# Patient Record
Sex: Male | Born: 1965 | Race: White | Hispanic: No | Marital: Single | Smoking: Former smoker
Health system: Southern US, Community
[De-identification: ages and names within clinical notes are randomized; demographics above are authoritative.]

## PROBLEM LIST (undated history)

## (undated) DIAGNOSIS — E079 Disorder of thyroid, unspecified: Secondary | ICD-10-CM

## (undated) HISTORY — PX: HERNIA REPAIR: SHX51

---

## 2016-06-08 ENCOUNTER — Telehealth: Payer: Self-pay | Admitting: *Deleted

## 2016-06-08 ENCOUNTER — Emergency Department
Admission: EM | Admit: 2016-06-08 | Discharge: 2016-06-08 | Disposition: A | Discharge status: Home / Self Care | Payer: Managed Care, Other (non HMO) | Source: Home / Self Care | Attending: Family Medicine | Admitting: Family Medicine

## 2016-06-08 ENCOUNTER — Encounter: Payer: Self-pay | Admitting: *Deleted

## 2016-06-08 DIAGNOSIS — J069 Acute upper respiratory infection, unspecified: Secondary | ICD-10-CM | POA: Diagnosis not present

## 2016-06-08 DIAGNOSIS — J302 Other seasonal allergic rhinitis: Secondary | ICD-10-CM

## 2016-06-08 DIAGNOSIS — B9789 Other viral agents as the cause of diseases classified elsewhere: Principal | ICD-10-CM

## 2016-06-08 DIAGNOSIS — Z8639 Personal history of other endocrine, nutritional and metabolic disease: Secondary | ICD-10-CM

## 2016-06-08 HISTORY — DX: Disorder of thyroid, unspecified: E07.9

## 2016-06-08 MED ORDER — PREDNISONE 20 MG PO TABS: 20.0000 mg | ORAL_TABLET | Freq: Two times a day (BID) | ORAL | 0 refills | Status: DC

## 2016-06-08 MED ORDER — GUAIFENESIN-CODEINE 100-10 MG/5ML PO SOLN: ORAL | 0 refills | Status: DC

## 2016-06-08 NOTE — ED Triage Notes (Signed)
Pt c/o 2 days of cough, runny nose and nasal congestion. Aches yesterday. Taken IBF @ home. Afebrile.

## 2016-06-08 NOTE — Telephone Encounter (Signed)
Pt reports the cough syrup prescribed here made him nauseated, requesting something else. Per Dr. Cathren Harsh, called in Benzonatate 200mg  1 qhs prn cough. Left on provider VM @ his [facility] pharmacy.

## 2016-06-08 NOTE — ED Provider Notes (Signed)
[facility]    CSN: [id number] Arrival date & time: 06/08/16  4098  First Provider Contact:  First MD Initiated Contact with Patient 06/08/16 1003        History   Chief Complaint Chief Complaint  Patient presents with  . Cough  . Nasal Congestion    HPI Thomas Patel is a 50 y.o. male.   Patient complains of three day history of typical cold-like symptoms developing over several days,  including mild sore throat, sinus congestion, headache, fatigue, and cough.  No fevers, chills, and sweats.  He has had occasional wheezing.   He has a history of seasonal rhinitis.  He notes that in the past his colds have always tended to linger.  Family history of asthma/COPD in his mother. He also has a history of hypothyroid and plans to establish care with a new PCP locally.  He states that he has not had followup of his TSH in over a year.   The history is provided by the patient.    Past Medical History:  Diagnosis Date  . Thyroid disease     There are no active problems to display for this patient.   Past Surgical History:  Procedure Laterality Date  . HERNIA REPAIR         Home Medications    Prior to Admission medications   Medication Sig Start Date End Date Taking? Authorizing Provider  levothyroxine (SYNTHROID, LEVOTHROID) 75 MCG tablet Take 75 mcg by mouth daily before breakfast.   Yes Historical Provider, MD  guaiFENesin-codeine 100-10 MG/5ML syrup Take 10mL by mouth at bedtime as needed for cough 06/08/16   Lattie Haw, MD  predniSONE (DELTASONE) 20 MG tablet Take 1 tablet (20 mg total) by mouth 2 (two) times daily. Take with food. 06/08/16   Lattie Haw, MD    Family History History reviewed.  Mother has asthma/COPD  Social History Social History  Substance Use Topics  . Smoking status: Former Smoker    Quit date: 06/08/1996  . Smokeless tobacco: Never Used  . Alcohol use No     Allergies   Review of patient's allergies indicates  no known allergies.   Review of Systems Review of Systems + sore throat + cough No pleuritic pain + occasional wheezing + nasal congestion + post-nasal drainage No sinus pain/pressure No itchy/red eyes No earache No hemoptysis No SOB No fever/chills No nausea No vomiting No abdominal pain No diarrhea No urinary symptoms No skin rash + fatigue + myalgias + headache Used OTC meds without relief   Physical Exam Triage Vital Signs ED Triage Vitals  Enc Vitals Group     BP 06/08/16 0957 119/79     Pulse Rate 06/08/16 0957 60     Resp 06/08/16 0957 16     Temp 06/08/16 0957 98.4 F (36.9 C)     Temp Source 06/08/16 0957 Oral     SpO2 06/08/16 0957 99 %     Weight 06/08/16 0957 259 lb (117.5 kg)     Height 06/08/16 0957 6' (1.829 m)     Head Circumference --      Peak Flow --      Pain Score 06/08/16 0959 0     Pain Loc --      Pain Edu? --      Excl. in GC? --    No data found.   Updated Vital Signs BP 119/79 (BP Location: Left Arm)   Pulse 60  Temp 98.4 F (36.9 C) (Oral)   Resp 16   Ht 6' (1.829 m)   Wt 259 lb (117.5 kg)   SpO2 99%   BMI 35.13 kg/m    Physical Exam Nursing notes and Vital Signs reviewed. Appearance:  Patient appears stated age, and in no acute distress Eyes:  Pupils are equal, round, and reactive to light and accomodation.  Extraocular movement is intact.  Conjunctivae are not inflamed  Ears:  Canals normal.  Tympanic membranes normal.  Nose:  Mildly congested turbinates.  No sinus tenderness.    Pharynx:  Normal Neck:  Supple.  Mildly tender enlarged posterior/lateral nodes are palpated bilaterally  Lungs:  Clear to auscultation.  Breath sounds are equal.  Moving air well. Heart:  Regular rate and rhythm without murmurs, rubs, or gallops.  Abdomen:  Nontender without masses or hepatosplenomegaly.  Bowel sounds are present.  No CVA or flank tenderness.  Extremities:  No edema.  Skin:  No rash present.    UC Treatments /  Results  Labs (all labs ordered are listed, but only abnormal results are displayed) Labs Reviewed - No data to display  EKG  EKG Interpretation None       Radiology No results found.  Procedures Procedures (including critical care time)  Medications Ordered in UC Medications - No data to display   Initial Impression / Assessment and Plan / UC Course  I have reviewed the triage vital signs and the nursing notes.  Pertinent labs & imaging results that were available during my care of the patient were reviewed by me and considered in my medical decision making (see chart for details).  Clinical Course       Final Clinical Impressions(s) / UC Diagnoses   Final diagnoses:  Viral URI with cough  History of hypothyroidism  Seasonal rhinitis   There is no evidence of bacterial infection today.   With patient's history of seasonal rhinitis, family history of asthma, and past history of viral URI's that linger, he probably has a predisposition to mild reactive airways disease. Will therefore begin prednisone burst. Rx for Robitussin AC for night time cough.  Take plain guaifenesin (1200mg  extended release tabs such as Mucinex) twice daily, with plenty of water, for cough and congestion.  May add Pseudoephedrine (30mg , one or two every 4 to 6 hours) for sinus congestion.  Get adequate rest.   May use Afrin nasal spray (or generic oxymetazoline) twice daily for about 5 days and then discontinue.  Also recommend using saline nasal spray several times daily and saline nasal irrigation (AYR is a common brand).  Use Flonase nasal spray each morning after using Afrin nasal spray and saline nasal irrigation. Try warm salt water gargles for sore throat.  Stop all antihistamines for now, and other non-prescription cough/cold preparations. May take Ibuprofen 200mg , 4 tabs every 8 hours with food for body aches, headache, sore throat, etc.   Follow-up with family doctor if not improving  about10 days. Patient also needs to establish care with a PCP for management of his hypothyroid. New Prescriptions New Prescriptions   GUAIFENESIN-CODEINE 100-10 MG/5ML SYRUP    Take 20mL by mouth at bedtime as needed for cough   PREDNISONE (DELTASONE) 20 MG TABLET    Take 1 tablet (20 mg total) by mouth 2 (two) times daily. Take with food.     Lattie Haw, MD 06/08/16 1052

## 2016-06-08 NOTE — Discharge Instructions (Signed)
Take plain guaifenesin (1200mg  extended release tabs such as Mucinex) twice daily, with plenty of water, for cough and congestion.  May add Pseudoephedrine (30mg , one or two every 4 to 6 hours) for sinus congestion.  Get adequate rest.   May use Afrin nasal spray (or generic oxymetazoline) twice daily for about 5 days and then discontinue.  Also recommend using saline nasal spray several times daily and saline nasal irrigation (AYR is a common brand).  Use Flonase nasal spray each morning after using Afrin nasal spray and saline nasal irrigation. Try warm salt water gargles for sore throat.  Stop all antihistamines for now, and other non-prescription cough/cold preparations. May take Ibuprofen 200mg , 4 tabs every 8 hours with food for body aches, headache, sore throat, etc.   Follow-up with family doctor if not improving about10 days.

## 2016-06-15 ENCOUNTER — Ambulatory Visit: Payer: Managed Care, Other (non HMO) | Admitting: Osteopathic Medicine

## 2016-06-16 ENCOUNTER — Ambulatory Visit: Payer: Managed Care, Other (non HMO) | Admitting: Osteopathic Medicine

## 2016-06-20 ENCOUNTER — Other Ambulatory Visit: Payer: Self-pay

## 2016-06-21 ENCOUNTER — Telehealth: Payer: Self-pay

## 2016-06-21 ENCOUNTER — Encounter: Payer: Self-pay | Admitting: Osteopathic Medicine

## 2016-06-21 ENCOUNTER — Ambulatory Visit (INDEPENDENT_AMBULATORY_CARE_PROVIDER_SITE_OTHER): Payer: Managed Care, Other (non HMO) | Admitting: Osteopathic Medicine

## 2016-06-21 VITALS — BP 125/78 | HR 91 | Ht 72.0 in | Wt 256.0 lb

## 2016-06-21 DIAGNOSIS — F411 Generalized anxiety disorder: Secondary | ICD-10-CM

## 2016-06-21 DIAGNOSIS — Z87891 Personal history of nicotine dependence: Secondary | ICD-10-CM

## 2016-06-21 DIAGNOSIS — E039 Hypothyroidism, unspecified: Secondary | ICD-10-CM | POA: Insufficient documentation

## 2016-06-21 DIAGNOSIS — Z0000 Encounter for general adult medical examination without abnormal findings: Secondary | ICD-10-CM

## 2016-06-21 MED ORDER — FLUOXETINE HCL 20 MG PO TABS: 20.0000 mg | ORAL_TABLET | Freq: Every day | ORAL | 3 refills | Status: DC

## 2016-06-21 NOTE — Telephone Encounter (Signed)
Patient called stated that he needs the note to say excuse him from work August 7th til now. I explained to the patient that if his job requested anything more we would only be able to provide information from today's visit only. Please advise . Stacey Sago,CMA

## 2016-06-21 NOTE — Progress Notes (Signed)
HPI: Thomas Patel is a 50 y.o. male  who [facility] today, 06/21/16,  for chief complaint of:  Chief Complaint  Patient presents with  . Establish Care    ANNUAL, THYROID MEDICATION AND ANXIETY ATTACKS     Hypothyroid: Long-standing diagnosis, patient has not had labs in over a year, has been taking medication regularly over the past month or so since he's been low.  Anxiety/panic: See below for GAD7 or PHQ9. Patient screened negative for mood disorder questionnaire. Reports increasing frequency of panic attacks, feels whole-body shakes, lightheaded, flushing, he is to be happening more with increased stress at work. Has been going on throughout his lifetime. He has never been on medications or sought counseling for this, never had to be in the hospital for any psychiatric reason. Patient is a lot questions about possible side effects of medication, he is nervous to start any type of medication. He is nervous to go to counseling because he doesn't want have to talk about his feelings. Patient is very concerned about possibility of weight gain on any type of medication    Past medical, surgical, social and family history reviewed: Past Medical History:  Diagnosis Date  . Thyroid disease    Past Surgical History:  Procedure Laterality Date  . HERNIA REPAIR     Social History  Substance Use Topics  . Smoking status: Former Smoker    Quit date: 06/13/1999  . Smokeless tobacco: Never Used  . Alcohol use No   Family History  Problem Relation Age of Onset  . Arthritis Mother   . Asthma Mother   . Lung disease Mother   . Cancer Paternal Aunt      Current medication list and allergy/intolerance information reviewed:   Current Outpatient Prescriptions  Medication Sig Dispense Refill  . levothyroxine (SYNTHROID, LEVOTHROID) 100 MCG tablet Take by mouth.     No current facility-administered medications for this visit.    No Known  Allergies    Review of Systems:  Constitutional:  No  fever, no chills, No recent illness, No unintentional weight changes. No significant fatigue.   HEENT: No  headache, no vision change, no hearing change, No sore throat, No  sinus pressure  Cardiac: No  chest pain, No  pressure, No palpitations, No  Orthopnea  Respiratory:  No  shortness of breath. No  Cough  Gastrointestinal: No  abdominal pain, No  nausea, No  vomiting,  No  blood in stool, No  diarrhea, No  constipation   Musculoskeletal: No new myalgia/arthralgia  Genitourinary: No  incontinence, No  abnormal genital bleeding, No abnormal genital discharge  Skin: No  Rash, No other wounds/concerning lesions  Hem/Onc: No  easy bruising/bleeding, No  abnormal lymph node  Endocrine: No cold intolerance,  No heat intolerance. No polyuria/polydipsia/polyphagia   Neurologic: No  weakness, No  dizziness, No  slurred speech/focal weakness/facial droop  Psychiatric: No  concerns with depression, (+) concerns with anxiety, No sleep problems, No mood problems  Exam:  BP 125/78   Pulse 91   Ht 6' (1.829 m)   Wt 256 lb (116.1 kg)   BMI 34.72 kg/m   Constitutional: VS see above. General Appearance: alert, well-developed, well-nourished, NAD  Eyes: Normal lids and conjunctive, non-icteric sclera  Ears, Nose, Mouth, Throat: MMM, Normal external inspection ears/nares/mouth/lips/gums.  Neck: No masses, trachea midline. No thyroid enlargement. No tenderness/mass appreciated. No lymphadenopathy  Respiratory: Normal respiratory effort. no wheeze, no rhonchi, no rales  Cardiovascular: S1/S2 normal, no murmur, no rub/gallop auscultated. RRR. No lower extremity edema  Musculoskeletal: Gait normal.   Neurological: Normal balance/coordination. No tremor.   Skin: warm, dry, intact. No rash/ulcer. No concerning nevi or subq nodules on limited exam.    Psychiatric: Fair judgment/insight. Normal mood and affect. Oriented x3. Pressured  speech but no thought disorder.   GAD 7 : Generalized Anxiety Score 06/21/2016  Nervous, Anxious, on Edge 1  Control/stop worrying 0  Worry too much - different things 1  Trouble relaxing 1  Restless 1  Easily annoyed or irritable 1  Afraid - awful might happen 0  Total GAD 7 Score 5  Anxiety Difficulty Somewhat difficult    Depression screen PHQ 2/9 06/21/2016  Decreased Interest 1  Down, Depressed, Hopeless 0  PHQ - 2 Score 1  Altered sleeping 1  Tired, decreased energy 1  Change in appetite 1  Feeling bad or failure about yourself  0  Trouble concentrating 3  Moving slowly or fidgety/restless 1  Suicidal thoughts 0  PHQ-9 Score 8     ASSESSMENT/PLAN:   Anxiety state - Plan: FLUoxetine (PROZAC) 20 MG tablet, Ambulatory referral to Psychology - counseling/CBT for panic issues - develop coping skills.   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 Patient is nervous about taking medications.   Given lack of identifiable trigger, not convinced that benzodiazepine therapy to premedicate for anxiety causing event will be of any help, would rather try out daily medication for prevention and see if this helps. Patient is educated at length on the option for her medication, counseling, he is going back and forth about whether or not to start medication  I sent in Prozac and we'll see how he does with this if he decides to take it. Patient advised on side effects of this medicine, is tolerating the medication can increase the dose in 2 weeks, plan to follow-up with me in the next 6 weeks for brief discussion about how this is working and may be able to do annual appointment. Would also consider diagnosis of ADHD  Hypothyroidism, unspecified hypothyroidism type - Plan: TSH - We'll hold off on refilling medication until we have lab  results, since he's been taking medication intermittently over the past 8 weeks or so, would repeat TSH again in 8 weeks after getting him on a stable dose  History of tobacco use disorder  Annual physical exam - Plan: CBC with Differential/Platelet, COMPLETE METABOLIC PANEL WITH GFR, Lipid panel - this is unlisted diagnoses for lab ordering purposes, I did not bill for this visit today    Visit summary with medication list and pertinent instructions was printed for patient to review. All questions at time of visit were answered - patient instructed to contact office with any additional concerns. ER/RTC precautions were reviewed with the patient. Follow-up plan: Return in about 6 weeks (around 08/02/2016), or sooner if needed, for ANNUAL PHYSICAL .  Note: Total time spent 45 minutes, greater than 50% of the visit was spent face-to-face counseling and coordinating care for the following: The primary encounter diagnosis was Anxiety state. Diagnoses of Hypothyroidism, unspecified hypothyroidism type, History of tobacco use disorder

## 2016-06-21 NOTE — Patient Instructions (Signed)
DR. Mardelle MatteALEXANDER'S IMPORTANT  INFORMATION FOR NEW PATIENTS!  PLEASE REVIEW CAREFULLY!    FOLLOW-UP!   Let's plan to follow-up in the office in 6 weeks for ANNUAL PHYSICAL AND RECHECK ANXIETY/PANIC SYMPTOMS.   Long term, let's plan to follow-up regularly months for management/monitoring of chronic medical issues such as thyroid issues, as well as management of your prescription medications.   Please don't hesitate to make an appointment sooner if you're having any acute concerns or problems!  REGARDING PRESCRIPTION MEDICATIONS  Please let your pharmacy know when you are running low on medications/refills (do not wait until you are out of medicines). Your pharmacy will send our office a request for the appropriate medications. Please allow our office 2-3 business days to process the needed refills.   For controlled substances, you may be asked to schedule an office visit and/or undergo a random urine drug screen for continuation of such medications.   REGARDING ANY CARE YOU RECEIVE OUTSIDE OUR OFFICE  At any visits to any specialists, or if you receive vaccines anywhere outside our office, please provide our clinic information so that they can forward us any records, including any tests which are done, changes to your medications, or new vaccinations.   Also, if you are ever treated in an emergency room or if you are admitted to the hospital, please contact our office after your discharge. We encourage our patients to schedule a follow-up visit with their PCP any time they are treated for a serious illness or injury.   This allows all your physicians to communicate effectively, putting your primary care doctor at the center of your medical care and allowing us to effectively coordinate your care.  REGARDING PREVENTIVE CARE & WELLNESS, AKA "ANNUAL PHYSICAL"  Let's plan to follow-up here in the office in 6 weeks for ANNUAL WELLNESS & PREVENTIVE CARE PHYSICAL.   This visit is very important to  make sure we talk with our patients about all recommended cancer screenings, vaccines, birth control (if desired) and screenings for other diseases based on your personal/family history. Plus, this visit should be completely covered under your insurance!   We also like to talk about any changes to your health over the past year and make sure any chronic conditions are well-controlled.    Please note: insurance should completely cover one preventive care visit annually, and they should completely cover most tests associated with preventive care such as routine labs, mammograms, etc. If you have any other medical concerns to address, you may be asked to reschedule your annual physical, or schedule a separate visit to address other medical concerns. Or, you may be billed for care related to "problem-based visit" in addition to your "preventive care visit." If you have questions about this, please contact our office, your insurance company, or [facility] billing department.   REGARDING ANY FORMS NEEDING YOUR DOCTOR'S SIGNATURE  If you ever have any paperwork which needs to be completed by your PCP, you may be asked to come to the office if that paperwork requires a complex review of your medical history - we want to make sure everything is completed to your satisfaction and is completed correctly the first time, particularly with FMLA or other employment/legal matters!    Please let us know if there is anything else we can do for you. Take care! -Dr. Mervyn SkeetersA.

## 2016-06-22 NOTE — Telephone Encounter (Signed)
I wrote that I saw the patient and gave date of exam and asked him to be excused for any time needed for the appointment - I was hoping this would be vague enough to allow him somewhat though room, I know that he had to take some time off work since he travels. If his employer is requiring documentation of his need to be out for more than the day of the appointment, I'm not sure that I can help with that since I did not see him on the seventh.   Please ask him to forward me a specific documentation requirements for his employer, otherwise, I can only provide documentation that I saw him on a given day.

## 2016-06-22 NOTE — Telephone Encounter (Signed)
Left detailed message on patient vm with exact message as noted below. Rhonda Cunningham,CMA

## 2016-06-23 LAB — CBC WITH DIFFERENTIAL/PLATELET
BASOS ABS: 97 {cells}/uL (ref 0–200)
BASOS PCT: 1 %
EOS ABS: 291 {cells}/uL (ref 15–500)
Eosinophils Relative: 3 %
HEMATOCRIT: 46.4 % (ref 38.5–50.0)
Hemoglobin: 16.2 g/dL (ref 13.2–17.1)
LYMPHS PCT: 35 %
Lymphs Abs: 3395 cells/uL (ref 850–3900)
MCH: 33.1 pg — AB (ref 27.0–33.0)
MCHC: 34.9 g/dL (ref 32.0–36.0)
MCV: 94.7 fL (ref 80.0–100.0)
MONO ABS: 873 {cells}/uL (ref 200–950)
MONOS PCT: 9 %
MPV: 11.2 fL (ref 7.5–12.5)
NEUTROS PCT: 52 %
Neutro Abs: 5044 cells/uL (ref 1500–7800)
PLATELETS: 264 10*3/uL (ref 140–400)
RBC: 4.9 MIL/uL (ref 4.20–5.80)
RDW: 13.3 % (ref 11.0–15.0)
WBC: 9.7 10*3/uL (ref 3.8–10.8)

## 2016-06-23 LAB — LIPID PANEL
CHOLESTEROL: 144 mg/dL (ref 125–200)
HDL: 40 mg/dL (ref >=40)
LDL Cholesterol: 87 mg/dL (ref <130)
TRIGLYCERIDES: 84 mg/dL (ref <150)
Total CHOL/HDL Ratio: 3.6 Ratio (ref <=5.0)
VLDL: 17 mg/dL (ref <30)

## 2016-06-23 LAB — COMPLETE METABOLIC PANEL WITH GFR
ALT: 13 U/L (ref 9–46)
AST: 14 U/L (ref 10–40)
Albumin: 4 g/dL (ref 3.6–5.1)
Alkaline Phosphatase: 68 U/L (ref 40–115)
BILIRUBIN TOTAL: 0.5 mg/dL (ref 0.2–1.2)
BUN: 16 mg/dL (ref 7–25)
CALCIUM: 9 mg/dL (ref 8.6–10.3)
CHLORIDE: 104 mmol/L (ref 98–110)
CO2: 27 mmol/L (ref 20–31)
CREATININE: 1.03 mg/dL (ref 0.60–1.35)
GFR, EST NON AFRICAN AMERICAN: 86 mL/min (ref >=60)
Glucose, Bld: 101 mg/dL — ABNORMAL HIGH (ref 65–99)
Potassium: 4.8 mmol/L (ref 3.5–5.3)
Sodium: 141 mmol/L (ref 135–146)
TOTAL PROTEIN: 6.2 g/dL (ref 6.1–8.1)

## 2016-06-23 LAB — TSH: TSH: 5.42 m[IU]/L — AB (ref 0.40–4.50)

## 2016-06-26 MED ORDER — LEVOTHYROXINE SODIUM 100 MCG PO TABS: 100.0000 ug | ORAL_TABLET | Freq: Every day | ORAL | 2 refills | Status: DC

## 2016-06-26 NOTE — Addendum Note (Signed)
Addended by: Deirdre PippinsALEXANDER, Anique Beckley M on: 06/26/2016 12:53 PM   Modules accepted: Orders

## 2016-06-29 ENCOUNTER — Encounter: Payer: Self-pay | Admitting: Osteopathic Medicine

## 2016-06-29 ENCOUNTER — Telehealth: Payer: Self-pay

## 2016-06-29 NOTE — Telephone Encounter (Signed)
Dizziness is a documented side effect which can occur in 9% of patients taking this medication. Since I did not personally evaluate him at the time of this episode, I could not say for sure whether this was simply due to the medicine or due to something else. If he requires a work note, I don't feel that I can excuse him from work for this incident, however I am happy to provide documentation of the 9% incidence of dizziness which may occur in this medicine. I printed a letter for him, left this at the front desk. He is having concerns for side effects of medications, he should come in for a visit so we can discuss alternative medicines

## 2016-06-29 NOTE — Telephone Encounter (Signed)
Patient called stated that started Prozac 20 mg and it makes him dizzy. Patient stated that he left work early today and request a letter from PCP explaining side effects of medications and excusing him from work today. Last OV was 06/21/2016. Please advise.Brithany Whitworth,CMA

## 2016-06-29 NOTE — Telephone Encounter (Signed)
PATIENT HAS BEEN INFORMED. Captain Blucher,CMA  

## 2016-06-30 ENCOUNTER — Ambulatory Visit: Payer: Managed Care, Other (non HMO) | Admitting: Osteopathic Medicine

## 2016-07-26 ENCOUNTER — Ambulatory Visit (INDEPENDENT_AMBULATORY_CARE_PROVIDER_SITE_OTHER): Payer: Managed Care, Other (non HMO) | Admitting: Osteopathic Medicine

## 2016-07-26 ENCOUNTER — Encounter: Payer: Self-pay | Admitting: Osteopathic Medicine

## 2016-07-26 VITALS — BP 118/68 | HR 69 | Ht 72.0 in | Wt 260.0 lb

## 2016-07-26 DIAGNOSIS — F411 Generalized anxiety disorder: Secondary | ICD-10-CM

## 2016-07-26 DIAGNOSIS — E039 Hypothyroidism, unspecified: Secondary | ICD-10-CM

## 2016-07-26 MED ORDER — FLUOXETINE HCL 40 MG PO CAPS: 40.0000 mg | ORAL_CAPSULE | Freq: Every day | ORAL | 1 refills | Status: DC

## 2016-07-26 NOTE — Patient Instructions (Signed)
First week in October - can come straight to lab for thyroid level recheck.

## 2016-07-26 NOTE — Progress Notes (Signed)
HPI: Thomas Patel is a 50 y.o. male  who presents to  [facility] today, 07/26/16,  for chief complaint of:  Chief Complaint  Patient presents with  . Medication Problem    PROZAC questions about side effects versus effectiveness of medicine     Anxiety/panic: See below for GAD7 & PHQ9. Patient screened negative for mood disorder questionnaireAt initial visit. Patient came to me with complaints of increasing frequency of panic attacks, feels whole-body shakes, lightheaded, flushing, he is to be happening more with increased stress at work. Has been going on throughout his lifetime. He notes that his symptoms are feeling a little bit better, he states his son has noticed a difference in his mood, seems much less curable. He has not increased the Prozac yet, has been on the medication for a total of about 3 weeks. He states that he was having a few mornings where he felt like he just couldn't get out of bed, but was able to go back to sleep. States that once he was up and moving around he felt a little bit better. He's wondering if this could be a side effect of the medication or just "all in my head". No thoughts of suicide.       Past medical, surgical, social and family history reviewed: Past Medical History:  Diagnosis Date  . Thyroid disease    Past Surgical History:  Procedure Laterality Date  . HERNIA REPAIR     Social History  Substance Use Topics  . Smoking status: Former Smoker    Quit date: 06/13/1999  . Smokeless tobacco: Never Used  . Alcohol use No   Family History  Problem Relation Age of Onset  . Arthritis Mother   . Asthma Mother   . Lung disease Mother   . Cancer Paternal Aunt      Current medication list and allergy/intolerance information reviewed:   Current Outpatient Prescriptions  Medication Sig Dispense Refill  . FLUoxetine (PROZAC) 20 MG tablet Take 1 tablet (20 mg total) by mouth daily. If no side effects, can  increase to 2 tablets daily after 2 weeks on the medicine 30 tablet 3  . levothyroxine (SYNTHROID, LEVOTHROID) 100 MCG tablet Take 1 tablet (100 mcg total) by mouth daily before breakfast. 30 tablet 2   No current facility-administered medications for this visit.    No Known Allergies    Review of Systems:  Constitutional:  No  fever, no chills, No recent illness, No unintentional weight changes. No significant fatigue.   Cardiac: No  chest pain, No  pressure  Respiratory:  No  shortness of breath  Gastrointestinal: No  abdominal pain, No  nausea  Psychiatric: (+)concerns with depression, (+) concerns with anxiety, No sleep problems, No mood problems  Exam:  BP 118/68   Pulse 69   Ht 6' (1.829 m)   Wt 260 lb (117.9 kg)   BMI 35.26 kg/m   Constitutional: VS see above. General Appearance: alert, well-developed, well-nourished, NAD  Ears, Nose, Mouth, Throat: MMM,  Respiratory: Normal respiratory effort.   Neurological: Normal balance/coordination. No tremor.   Skin: warm, dry, intact  Psychiatric: Normal judgment/insight. Normal mood and affect. Oriented x3. Pressured speech but no thought disorder. Seems a bit calmer than he was at last visit but still a bit anxious  GAD 7 : Generalized Anxiety Score 06/21/2016  Nervous, Anxious, on Edge 1  Control/stop worrying 0  Worry too much - different things 1  Trouble relaxing 1  Restless 1  Easily annoyed or irritable 1  Afraid - awful might happen 0  Total GAD 7 Score 5  Anxiety Difficulty Somewhat difficult    Depression screen PHQ 2/9 06/21/2016  Decreased Interest 1  Down, Depressed, Hopeless 0  PHQ - 2 Score 1  Altered sleeping 1  Tired, decreased energy 1  Change in appetite 1  Feeling bad or failure about yourself  0  Trouble concentrating 3  Moving slowly or fidgety/restless 1  Suicidal thoughts 0  PHQ-9 Score 8     ASSESSMENT/PLAN:   Anxiety state - Can go ahead and increase the dose of the Prozac and  see if this helps. Plan to follow-up in 6 weeks  Hypothyroidism, unspecified hypothyroidism type - Plan: TSH   Patient Instructions  First week in October - can come straight to lab for thyroid level recheck.      Visit summary with medication list and pertinent instructions was printed for patient to review. All questions at time of visit were answered - patient instructed to contact office with any additional concerns. ER/RTC precautions were reviewed with the patient. Follow-up plan: Return in about 6 weeks (around 09/06/2016) for MEDICATION MANAGEMENT FOR ANXIETY.

## 2016-08-25 ENCOUNTER — Other Ambulatory Visit: Payer: Self-pay | Admitting: Osteopathic Medicine

## 2016-08-26 ENCOUNTER — Other Ambulatory Visit: Payer: Self-pay | Admitting: Osteopathic Medicine

## 2016-09-08 ENCOUNTER — Other Ambulatory Visit: Payer: Self-pay | Admitting: Osteopathic Medicine

## 2016-09-17 ENCOUNTER — Emergency Department (INDEPENDENT_AMBULATORY_CARE_PROVIDER_SITE_OTHER)
Admission: EM | Admit: 2016-09-17 | Discharge: 2016-09-17 | Disposition: A | Discharge status: Home / Self Care | Payer: Managed Care, Other (non HMO) | Source: Home / Self Care | Attending: Family Medicine | Admitting: Family Medicine

## 2016-09-17 ENCOUNTER — Emergency Department (INDEPENDENT_AMBULATORY_CARE_PROVIDER_SITE_OTHER): Payer: Managed Care, Other (non HMO)

## 2016-09-17 DIAGNOSIS — J209 Acute bronchitis, unspecified: Secondary | ICD-10-CM | POA: Diagnosis not present

## 2016-09-17 DIAGNOSIS — I700 Atherosclerosis of aorta: Secondary | ICD-10-CM | POA: Diagnosis not present

## 2016-09-17 MED ORDER — PREDNISONE 20 MG PO TABS: ORAL_TABLET | ORAL | 0 refills | Status: DC

## 2016-09-17 MED ORDER — BENZONATATE 100 MG PO CAPS: 100.0000 mg | ORAL_CAPSULE | Freq: Three times a day (TID) | ORAL | 0 refills | Status: DC

## 2016-09-17 MED ORDER — ONDANSETRON HCL 4 MG PO TABS: 4.0000 mg | ORAL_TABLET | Freq: Four times a day (QID) | ORAL | 0 refills | Status: DC

## 2016-09-17 MED ORDER — DEXAMETHASONE SODIUM PHOSPHATE 10 MG/ML IJ SOLN: 10.0000 mg | Freq: Once | INTRAMUSCULAR | Status: DC

## 2016-09-17 MED ORDER — PREDNISONE 50 MG PO TABS: 60.0000 mg | ORAL_TABLET | Freq: Once | ORAL | Status: DC

## 2016-09-17 MED ORDER — ALBUTEROL SULFATE HFA 108 (90 BASE) MCG/ACT IN AERS: 1.0000 | INHALATION_SPRAY | Freq: Four times a day (QID) | RESPIRATORY_TRACT | 0 refills | Status: AC | PRN

## 2016-09-17 MED ORDER — AZITHROMYCIN 250 MG PO TABS: 250.0000 mg | ORAL_TABLET | Freq: Every day | ORAL | 0 refills | Status: DC

## 2016-09-17 NOTE — ED Triage Notes (Signed)
Patient c/o cough, congestion, fever, chills also states that he has been short of breath. Feels weak and has body aches.

## 2016-09-17 NOTE — Discharge Instructions (Signed)
°  Do not take anymore Dayquil/Nyquild or other cough/cold medications or decongestants today to help prevent damage to your liver and other organs.    You may have NSAIDs such as naproxen, ibuprofen, or aspirin as needed for headache, body aches, or fever. Do not take more than the recommended dose per product packaging is taking more than recommended can cause stomach upset, stomach ulcers, or even intestinal bleeding.   Please follow up with primary care this week if not improving. Is symptoms worsening tonight or the next few days such as chest pain, difficulty breathing, passing out or other concerns, please call 911 or go to closest emergency department.

## 2016-09-17 NOTE — ED Provider Notes (Signed)
CSN: [id number]     Arrival date & time 09/17/16  1422 History   First MD Initiated Contact with Patient 09/17/16 1430     Chief Complaint  Patient presents with  . Cough   (Consider location/radiation/quality/duration/timing/severity/associated sxs/prior Treatment) HPI Thomas Patel is a 50 y.o. male presenting to UC with wife c/o worsening SOB that started yesterday after 1 week of moderately productive cough, associated fatigue and body aches.  He has also had a subjective fever with chills.  Pt reports taking 6 tabs of Dayquil this morning but no relief of his symptoms.  Wife notes pt does have hx of anxiety and is concerned it is worse as pt has not been taking his Prozac and did not get a good night sleep last night due to being congested and coughing. No hx of asthma or COPD but he has used an inhaler in the past. Denies chest pain or diarrhea but did vomit once on way to UC. Current nausea. No sick contacts or recent travel.    Past Medical History:  Diagnosis Date  . Thyroid disease    Past Surgical History:  Procedure Laterality Date  . HERNIA REPAIR     Family History  Problem Relation Age of Onset  . Arthritis Mother   . Asthma Mother   . Lung disease Mother   . Cancer Paternal Aunt    Social History  Substance Use Topics  . Smoking status: Former Smoker    Quit date: 06/13/1999  . Smokeless tobacco: Never Used  . Alcohol use No    Review of Systems  Constitutional: Positive for appetite change, chills, fatigue and fever.  HENT: Positive for congestion, rhinorrhea, sinus pain and sore throat. Negative for ear pain, trouble swallowing and voice change.   Respiratory: Positive for cough, shortness of breath and wheezing.   Cardiovascular: Negative for chest pain and palpitations.  Gastrointestinal: Negative for abdominal pain, diarrhea, nausea and vomiting.  Musculoskeletal: Negative for arthralgias, back pain and myalgias.  Skin: Negative for rash.  Neurological:  Positive for headaches. Negative for dizziness and light-headedness.    Allergies  Patient has no known allergies.  Home Medications   Prior to Admission medications   Medication Sig Start Date End Date Taking? Authorizing Provider  FLUoxetine (PROZAC) 40 MG capsule Take 1 capsule (40 mg total) by mouth daily. 07/26/16  Yes Sunnie NielsenNatalie Doublin, DO  levothyroxine (SYNTHROID, LEVOTHROID) 100 MCG tablet TAKE 1 TABLET(100 MCG) BY MOUTH DAILY BEFORE BREAKFAST 08/28/16  Yes Sunnie NielsenNatalie Keiper, DO  albuterol (PROVENTIL HFA;VENTOLIN HFA) 108 (90 Base) MCG/ACT inhaler Inhale 1-2 puffs into the lungs every 6 (six) hours as needed for wheezing or shortness of breath. 09/17/16   Junius FinnerErin O'Malley, PA-C  azithromycin (ZITHROMAX) 250 MG tablet Take 1 tablet (250 mg total) by mouth daily. Take first 2 tablets together, then 1 every day until finished. 09/17/16   Junius FinnerErin O'Malley, PA-C  benzonatate (TESSALON) 100 MG capsule Take 1-2 capsules (100-200 mg total) by mouth every 8 (eight) hours. 09/17/16   Junius FinnerErin O'Malley, PA-C  levothyroxine (SYNTHROID, LEVOTHROID) 100 MCG tablet TAKE 1 TABLET(100 MCG) BY MOUTH DAILY BEFORE BREAKFAST 08/28/16   Sunnie NielsenNatalie Cantu, DO  ondansetron (ZOFRAN) 4 MG tablet Take 1 tablet (4 mg total) by mouth every 6 (six) hours. 09/17/16   Junius FinnerErin O'Malley, PA-C  predniSONE (DELTASONE) 20 MG tablet 3 tabs po day one, then 2 po daily x 4 days 09/17/16   Junius FinnerErin O'Malley, PA-C   Meds Ordered and Administered this Visit  Medications - No data to display  BP 128/85 (BP Location: Left Arm)   Pulse 82   Temp 97.7 F (36.5 C) (Oral)   Wt 247 lb (112 kg)   SpO2 97%   BMI 33.50 kg/m  No data found.   Physical Exam  Constitutional: He appears well-developed and well-nourished. He appears distressed.  Pt sitting on exam bed in near tri-pod position, appears anxious and short of breath but able to calm breathing when engaged in conversation.   HENT:  Head: Normocephalic and atraumatic.  Right Ear:  Tympanic membrane normal.  Left Ear: Tympanic membrane normal.  Nose: Mucosal edema present. Right sinus exhibits no maxillary sinus tenderness and no frontal sinus tenderness. Left sinus exhibits no maxillary sinus tenderness and no frontal sinus tenderness.  Mouth/Throat: Uvula is midline, oropharynx is clear and moist and mucous membranes are normal.  Eyes: Conjunctivae are normal. No scleral icterus.  Neck: Normal range of motion.  Cardiovascular: Normal rate, regular rhythm and normal heart sounds.   Pulmonary/Chest: Effort normal. No respiratory distress. He has wheezes. He has rhonchi. He has no rales. He exhibits no tenderness.  Diffuse coarse breath sounds with wheeze  Abdominal: Soft. He exhibits no distension. There is no tenderness.  Musculoskeletal: Normal range of motion.  Neurological: He is alert.  Skin: Skin is warm and dry. He is not diaphoretic.  Nursing note and vitals reviewed.   Urgent Care Course   Clinical Course     Procedures (including critical care time)  Labs Review Labs Reviewed - No data to display  Imaging Review Dg Chest 2 View  Result Date: 09/17/2016 CLINICAL DATA:  Pt states that for the past 3 days he has had a cough, congestion, sob, fever, chills, body aches and weakness. EXAM: CHEST  2 VIEW COMPARISON:  None. FINDINGS: Midline trachea. Normal heart size. Atherosclerosis in the transverse aorta. No pleural effusion or pneumothorax. Clear lungs. IMPRESSION: No acute cardiopulmonary disease. Significantly age advanced aortic atherosclerosis. Electronically Signed   By: Jeronimo GreavesKyle  Talbot M.D.   On: 09/17/2016 15:01     MDM   1. Acute bronchitis, unspecified organism    Pt presenting to UC with SOB after 1 week of cough and congestion. Diffuse wheeze and coarse breath sounds on exam. Pt also notes he took 6 tabs of Dayquil this morning.  Consulted with Chales AbrahamsMary Ann with Poison Control.  No special labs needed at this time. Advised not to take anymore  decongestants or acetaminophen the rest of the day. May have NSAIDs for pain or fever if needed. Do not take more than recommended on product packaging.  Pt given Duoneb and 2L O2 via nasal canula. Pt's breathing improved. Pt became more relaxed after lying down on exam bed and resting.    CXR: no evidence of pneumonia.  Will treat for acute bronchitis and cover for atypical bacteria    Rx: Azithromycin, prednisone, tessalon, and albuterol. Phenergan for nausea.  F/u with PCP this week if not improving. Work note provided for 2 days off.   Junius FinnerErin O'Malley, PA-C 09/17/16 2304

## 2016-09-18 ENCOUNTER — Telehealth: Payer: Self-pay | Admitting: *Deleted

## 2016-09-18 NOTE — Telephone Encounter (Signed)
Called to check pt's status today. LM to call back if he has any questions or concerns.

## 2016-09-22 ENCOUNTER — Other Ambulatory Visit: Payer: Self-pay | Admitting: Osteopathic Medicine

## 2016-09-25 ENCOUNTER — Telehealth: Payer: Self-pay | Admitting: *Deleted

## 2016-09-25 MED ORDER — LEVOTHYROXINE SODIUM 100 MCG PO TABS: ORAL_TABLET | ORAL | 2 refills | Status: DC

## 2016-09-25 NOTE — Telephone Encounter (Signed)
Patient left a voicemail stating he needed his thyroid medication called in today. When I looked in the chart it looks like a prescription was sent on 08/28/16 for # 90 w/ 2 refills twice so the pharmacy may very well have canceled both by accident. I called the patient to see what is going on and the patient was  I tried to let the patient know that from our end it looks like medications were sent but he states the pharmacy denied them. I asked him if he was out of medication and he states he was and he took his last pill today. I said I would have to call the pharmacy and see what is going on. He then became more  Irritated and stated again that the pharmacy denied it whatever I needed to do just do it and fix the problem. I then said to him that I was trying to help him and I was just trying to get information from him but that he kept interrupting me. He laughed and said oh  "you have a big ego and I'm trying to help you but now your trying to help me. Well what do you want me to say now I'm sorry because you have a big ego. If that's what it takes for you to get the job done. Now are you going to fix it and how do I know it's been taken care of because you are being very rude." After he finished talking I stated I would call the pharmacy and see what is going on and then give him a call back. He then hung the phone up.  Called the pharm and pharmacist there states they never received anything and that sometimes surescripts will not communicate with them and so when we see on our end that a rx was confirmed at the pharm that is not always the case on their end. Called in 90 day supply with 2 refills and previous rx stated.  Called the patient back and he thanked me and apologized and confirmed that it was the levothyroxine that was called in and states "we're good" and he that he was out of town and would have to get the medication transferred. Call was ended.

## 2016-10-26 ENCOUNTER — Telehealth: Payer: Self-pay

## 2016-10-26 NOTE — Telephone Encounter (Signed)
Patient called requested a refill for Fluoxetine 40 mg. Patient was advised on last refill which was 09/22/16 that he needed to follow up in the office.  Patient also was supposed to get TSH labs and that has not been completed either. Please see your note below. Taysia Rivere,CMA

## 2016-10-27 MED ORDER — FLUOXETINE HCL 40 MG PO CAPS: 40.0000 mg | ORAL_CAPSULE | Freq: Every day | ORAL | 0 refills | Status: DC

## 2016-10-27 NOTE — Telephone Encounter (Signed)
Called patient and left a message advising that Fluoxetine 40 mg #30 0 refills  has been sent to [facility] and that no more refills will be given without a appointment.also left a message advising patient to call [phone] to schedule an appointment to follow up before he runs out of medication. [name],CMA

## 2016-10-27 NOTE — Telephone Encounter (Signed)
Okay to send a refill for 30 days but no further refills will be authorized if he does not follow-up in the office for monitoring of his medical condition and prescriptions. Per my last note in September, follow-up in 6 weeks was recommended and he is overdue for this visit.

## 2016-10-31 ENCOUNTER — Telehealth: Payer: Self-pay

## 2016-10-31 DIAGNOSIS — E039 Hypothyroidism, unspecified: Secondary | ICD-10-CM

## 2016-11-01 NOTE — Telephone Encounter (Signed)
Patient called requested labs for TSH recheck. Labs have been ordered and a message was left on patient vm advising that the order was in and he could just go to lab. Aphrodite Harpenau,CMA

## 2016-11-08 LAB — TSH: TSH: 2.51 mIU/L (ref 0.40–4.50)

## 2016-11-09 ENCOUNTER — Encounter: Payer: Self-pay | Admitting: Osteopathic Medicine

## 2016-11-09 ENCOUNTER — Ambulatory Visit (INDEPENDENT_AMBULATORY_CARE_PROVIDER_SITE_OTHER): Payer: Managed Care, Other (non HMO) | Admitting: Osteopathic Medicine

## 2016-11-09 VITALS — BP 117/57 | HR 62 | Ht 72.0 in | Wt 248.0 lb

## 2016-11-09 DIAGNOSIS — E039 Hypothyroidism, unspecified: Secondary | ICD-10-CM

## 2016-11-09 DIAGNOSIS — F411 Generalized anxiety disorder: Secondary | ICD-10-CM | POA: Diagnosis not present

## 2016-11-09 MED ORDER — FLUOXETINE HCL 40 MG PO CAPS: 40.0000 mg | ORAL_CAPSULE | Freq: Every day | ORAL | 3 refills | Status: AC

## 2016-11-09 MED ORDER — LEVOTHYROXINE SODIUM 100 MCG PO TABS: 100.0000 ug | ORAL_TABLET | Freq: Every day | ORAL | 3 refills | Status: AC

## 2016-11-09 NOTE — Progress Notes (Signed)
HPI: Thomas Patel is a 50 y.o. male  who [facility] today, 11/09/16,  for chief complaint of:  Chief Complaint  Patient presents with  . Follow-up    ANXIETY    Anxiety: Doing much better on the Prozac. They like to continue at current dose of this medication. Requests 90 day supply of prescription. See below for GAD 7 and PHQ9.  Hypothyroid: Recent TSH normal after medication adjustment. No palpitations, hair or skin changes  Past medical, surgical, social and family history reviewed: Patient Active Problem List   Diagnosis Date Noted  . Hypothyroidism 06/21/2016  . History of tobacco use disorder 06/21/2016  . Anxiety state 06/21/2016   Past Surgical History:  Procedure Laterality Date  . HERNIA REPAIR     Social History  Substance Use Topics  . Smoking status: Former Smoker    Quit date: 06/13/1999  . Smokeless tobacco: Never Used  . Alcohol use No   Family History  Problem Relation Age of Onset  . Arthritis Mother   . Asthma Mother   . Lung disease Mother   . Cancer Paternal Aunt      Current medication list and allergy/intolerance information reviewed:   Current Outpatient Prescriptions on File Prior to Visit  Medication Sig Dispense Refill  . albuterol (PROVENTIL HFA;VENTOLIN HFA) 108 (90 Base) MCG/ACT inhaler Inhale 1-2 puffs into the lungs every 6 (six) hours as needed for wheezing or shortness of breath. 1 Inhaler 0  . FLUoxetine (PROZAC) 40 MG capsule Take 1 capsule (40 mg total) by mouth daily. NO MORE REFILLS WITHOUT AN APPOINTMENT! 30 capsule 0  . levothyroxine (SYNTHROID, LEVOTHROID) 100 MCG tablet TAKE 1 TABLET(100 MCG) BY MOUTH DAILY BEFORE BREAKFAST 90 tablet 2   No current facility-administered medications on file prior to visit.    No Known Allergies    Review of Systems:  Constitutional: No recent illness  Cardiac: No  chest pain  Respiratory:  No  shortness of breath.   Psychiatric: No   concerns with depression, No  concerns with anxiety  Exam:  BP (!) 117/57   Pulse 62   Ht 6' (1.829 m)   Wt 248 lb (112.5 kg)   BMI 33.63 kg/m   Constitutional: VS see above. General Appearance: alert, well-developed, well-nourished, NAD  Ears, Nose, Mouth, Throat: MMM,  Neck: No masses, trachea midline.   Respiratory: Normal respiratory effort. no wheeze, no rhonchi, no rales  Cardiovascular: S1/S2 normal, no murmur, no rub/gallop auscultated. RRR.   Musculoskeletal: Gait normal.   Neurological: Normal balance/coordination. No tremor.  Skin: warm, dry, intact.   Psychiatric: Normal judgment/insight. Normal mood and affect. Oriented x3.    Results for orders placed or performed in visit on 10/31/16 (from the past 72 hour(s))  TSH     Status: None   Collection Time: 11/08/16  8:19 AM  Result Value Ref Range   TSH 2.51 0.40 - 4.50 mIU/L    No results found.   GAD 7 : Generalized Anxiety Score 11/09/2016 06/21/2016  Nervous, Anxious, on Edge 0 1  Control/stop worrying 0 0  Worry too much - different things 0 1  Trouble relaxing 0 1  Restless 1 1  Easily annoyed or irritable 1 1  Afraid - awful might happen 0 0  Total GAD 7 Score 2 5  Anxiety Difficulty - Somewhat difficult    Dep[facility] 11/09/2016 06/21/2016  Decreased Interest 0 1  Down, Depressed, Hopeless 0  0  PHQ - 2 Score 0 1  Altered sleeping 0 1  Tired, decreased energy 0 1  Change in appetite 0 1  Feeling bad or failure about yourself  0 0  Trouble concentrating 1 3  Moving slowly or fidgety/restless 0 1  Suicidal thoughts 0 0  PHQ-9 Score 1 8       ASSESSMENT/PLAN:   Anxiety state - Plan: FLUoxetine (PROZAC) 40 MG capsule  Hypothyroidism, unspecified type - Plan: levothyroxine (SYNTHROID, LEVOTHROID) 100 MCG tablet    Patient Instructions  If desire labs ahead of time for annual physical, give us a call a week prior to your visit    Visit summary with medication list and  pertinent instructions was printed for patient to review. All questions at time of visit were answered - patient instructed to contact office with any additional concerns. ER/RTC precautions were reviewed with the patient. Follow-up plan: Return in about 8 months (around 07/13/2017) for ANNUAL PHYSICAL.

## 2016-11-09 NOTE — Patient Instructions (Signed)
If desire labs ahead of time for annual physical, give us a call a week prior to your visit

## 2017-07-19 IMAGING — DX DG CHEST 2V
2 series · 2 of 2 positions shown · non-contrast
Comparison: None.

CLINICAL DATA: Pt states that for the past 3 days he has had a
cough, congestion, sob, fever, chills, body aches and weakness.

EXAM:
CHEST  2 VIEW

[chest pa]
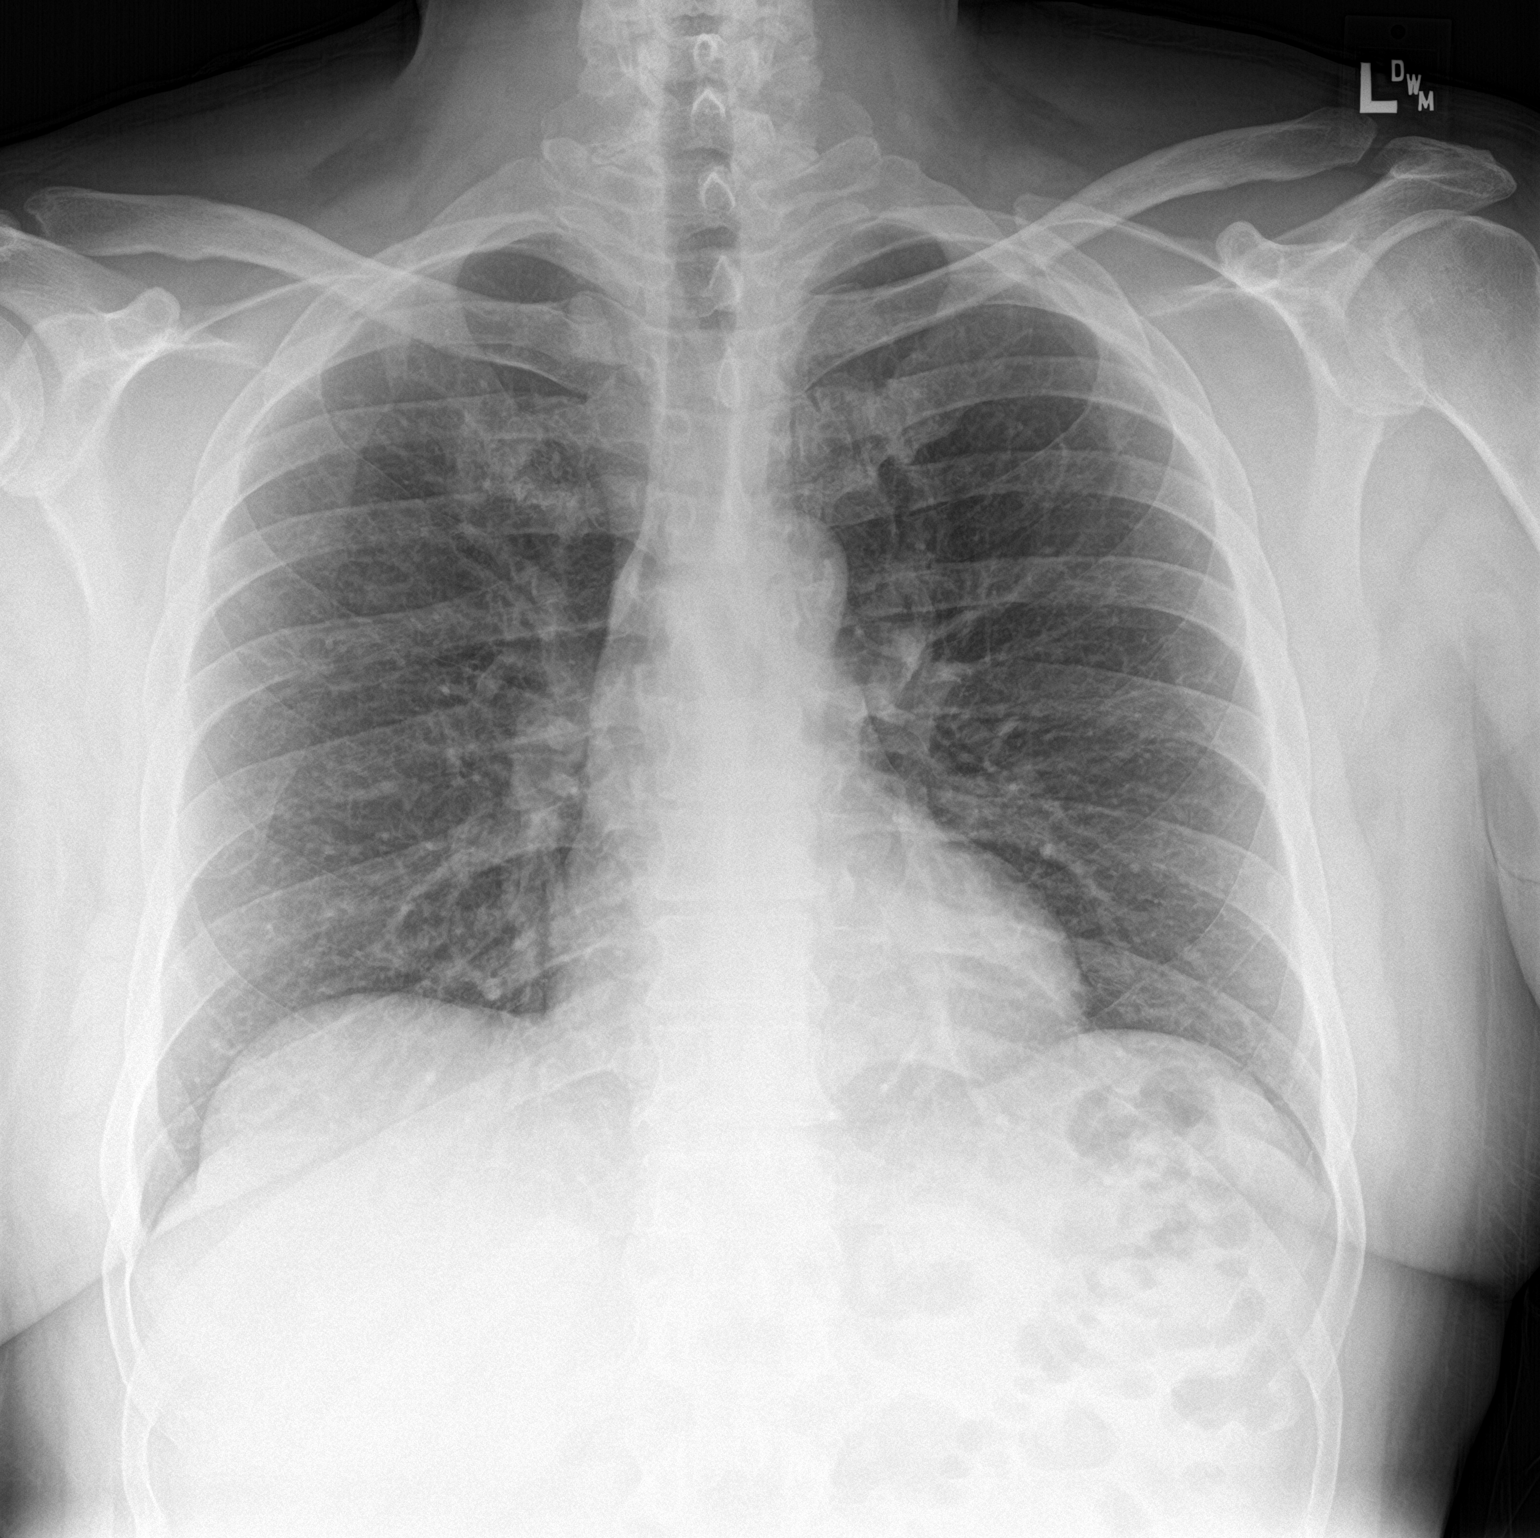

[chest lat]
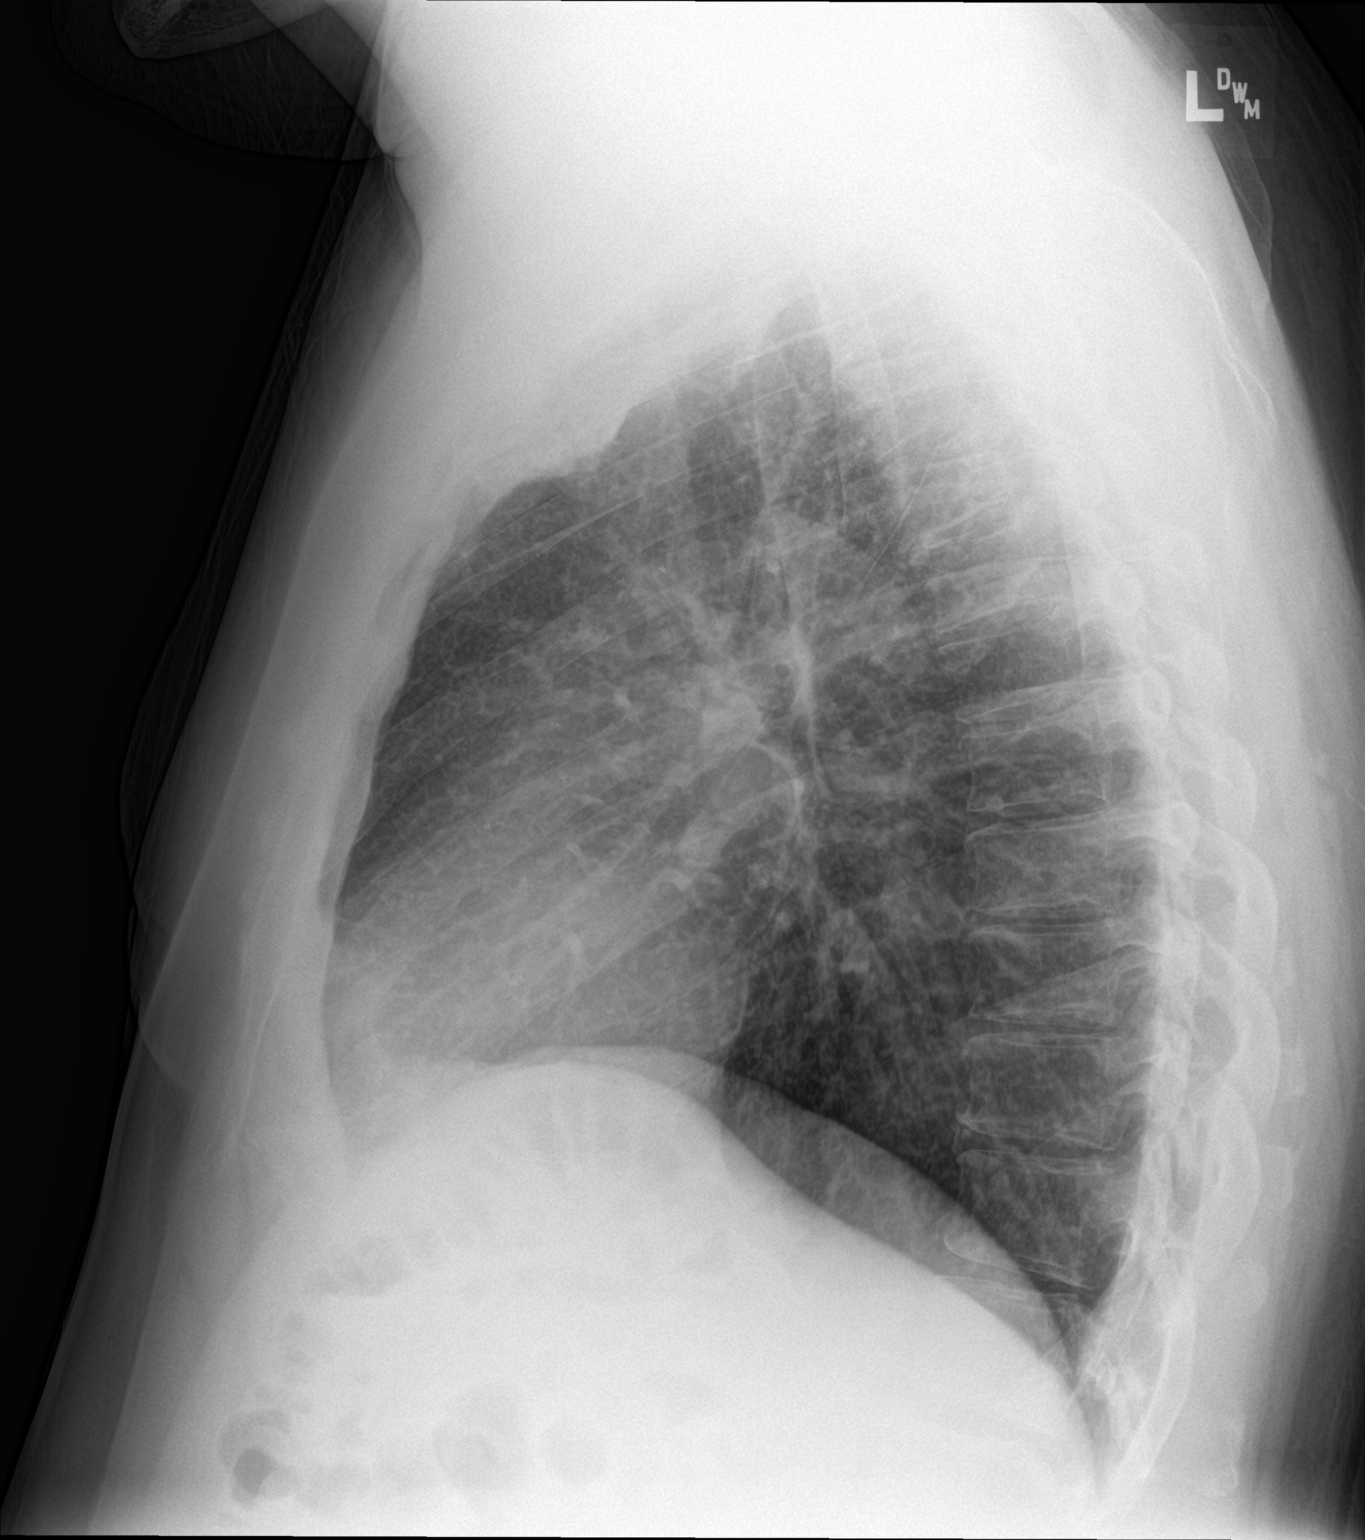

[2 of 2 positions shown; findings below may reference images not displayed]

FINDINGS: Midline trachea. Normal heart size. Atherosclerosis in the
transverse aorta. No pleural effusion or pneumothorax. Clear lungs.
IMPRESSION: No acute cardiopulmonary disease.

Significantly age advanced aortic atherosclerosis.
